# Patient Record
Sex: Male | Born: 1985 | Race: Black or African American | Hispanic: No | Marital: Single | Smoking: Never smoker
Health system: Southern US, Community
[De-identification: ages and names within clinical notes are randomized; demographics above are authoritative.]

---

## 2004-07-10 ENCOUNTER — Ambulatory Visit: Payer: Self-pay | Admitting: Pediatrics

## 2007-03-09 ENCOUNTER — Emergency Department: Payer: Self-pay | Admitting: Emergency Medicine

## 2009-07-15 ENCOUNTER — Emergency Department: Payer: Self-pay | Admitting: Emergency Medicine

## 2010-04-21 ENCOUNTER — Emergency Department: Payer: Self-pay | Admitting: Emergency Medicine

## 2010-08-04 ENCOUNTER — Emergency Department: Payer: Self-pay | Admitting: Emergency Medicine

## 2011-01-18 ENCOUNTER — Emergency Department: Payer: Self-pay | Admitting: Unknown Physician Specialty

## 2011-03-08 ENCOUNTER — Emergency Department: Payer: Self-pay | Admitting: Emergency Medicine

## 2014-01-14 ENCOUNTER — Ambulatory Visit: Payer: Self-pay | Admitting: Family Medicine

## 2014-02-08 ENCOUNTER — Ambulatory Visit: Payer: Self-pay | Admitting: Family Medicine

## 2015-09-26 IMAGING — CR CERVICAL SPINE - FLEXION AND EXTENSION VIEWS ONLY
1 series · 3 of 3 positions shown · non-contrast
Comparison: Cervical spine series of January 14, 2014

CLINICAL DATA: [age] male with down syndrome. For
clearance for sports; assess subtle and toe dense instability.

EXAM:
CERVICAL SPINE - FLEXION AND EXTENSION VIEWS ONLY

[Series 1: flexion · 0.17mm/px · 3 of 3 slices shown]
[im 1/3]
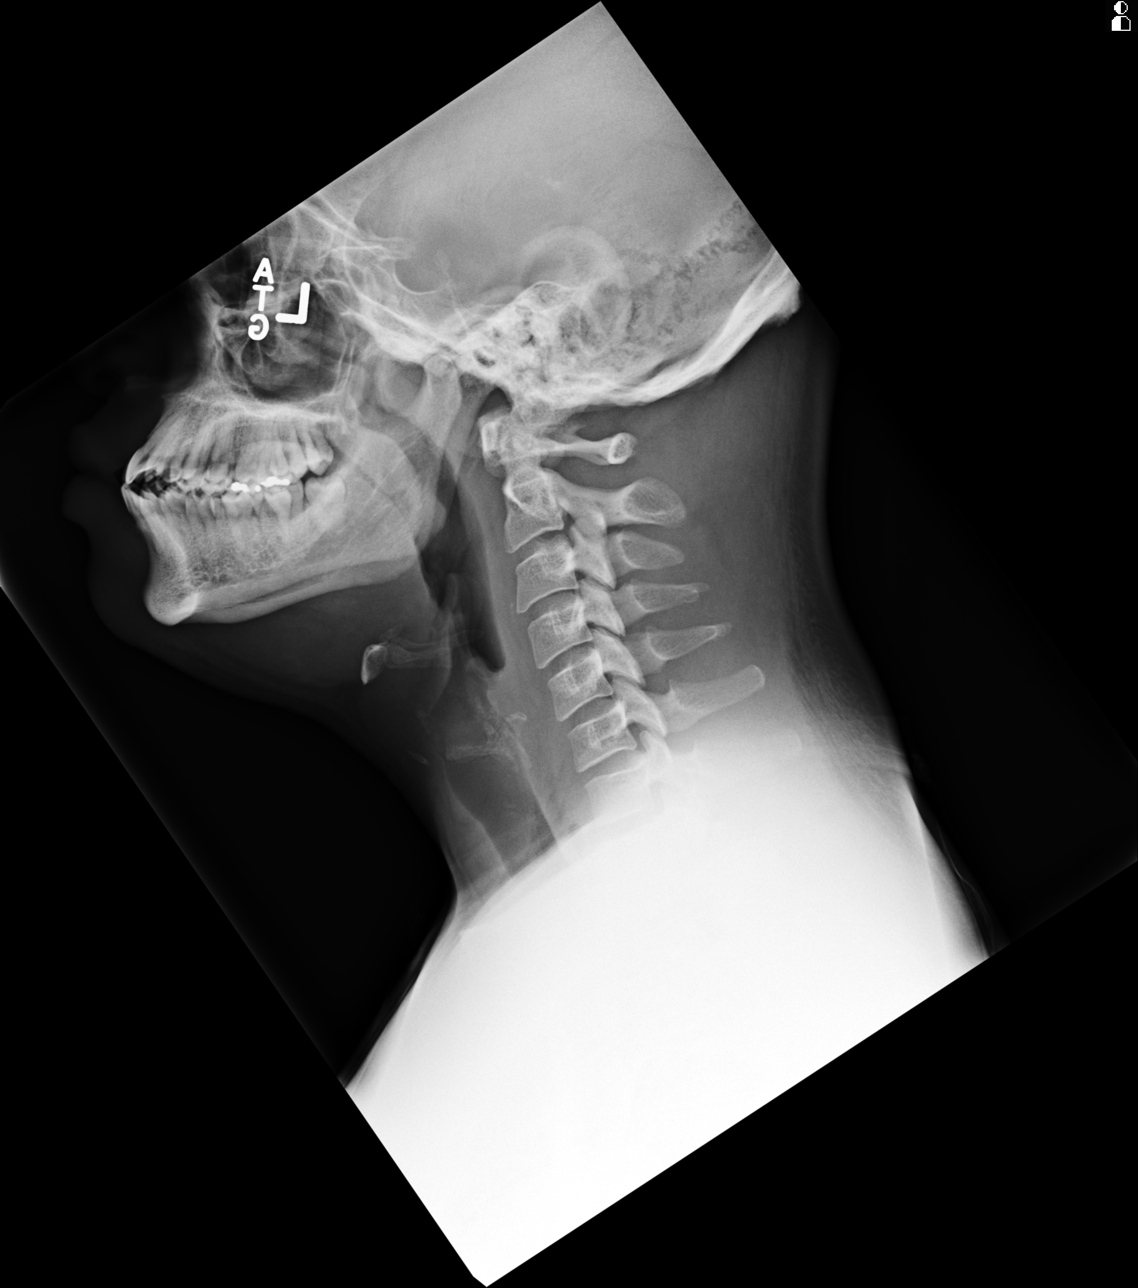
[im 2/3]
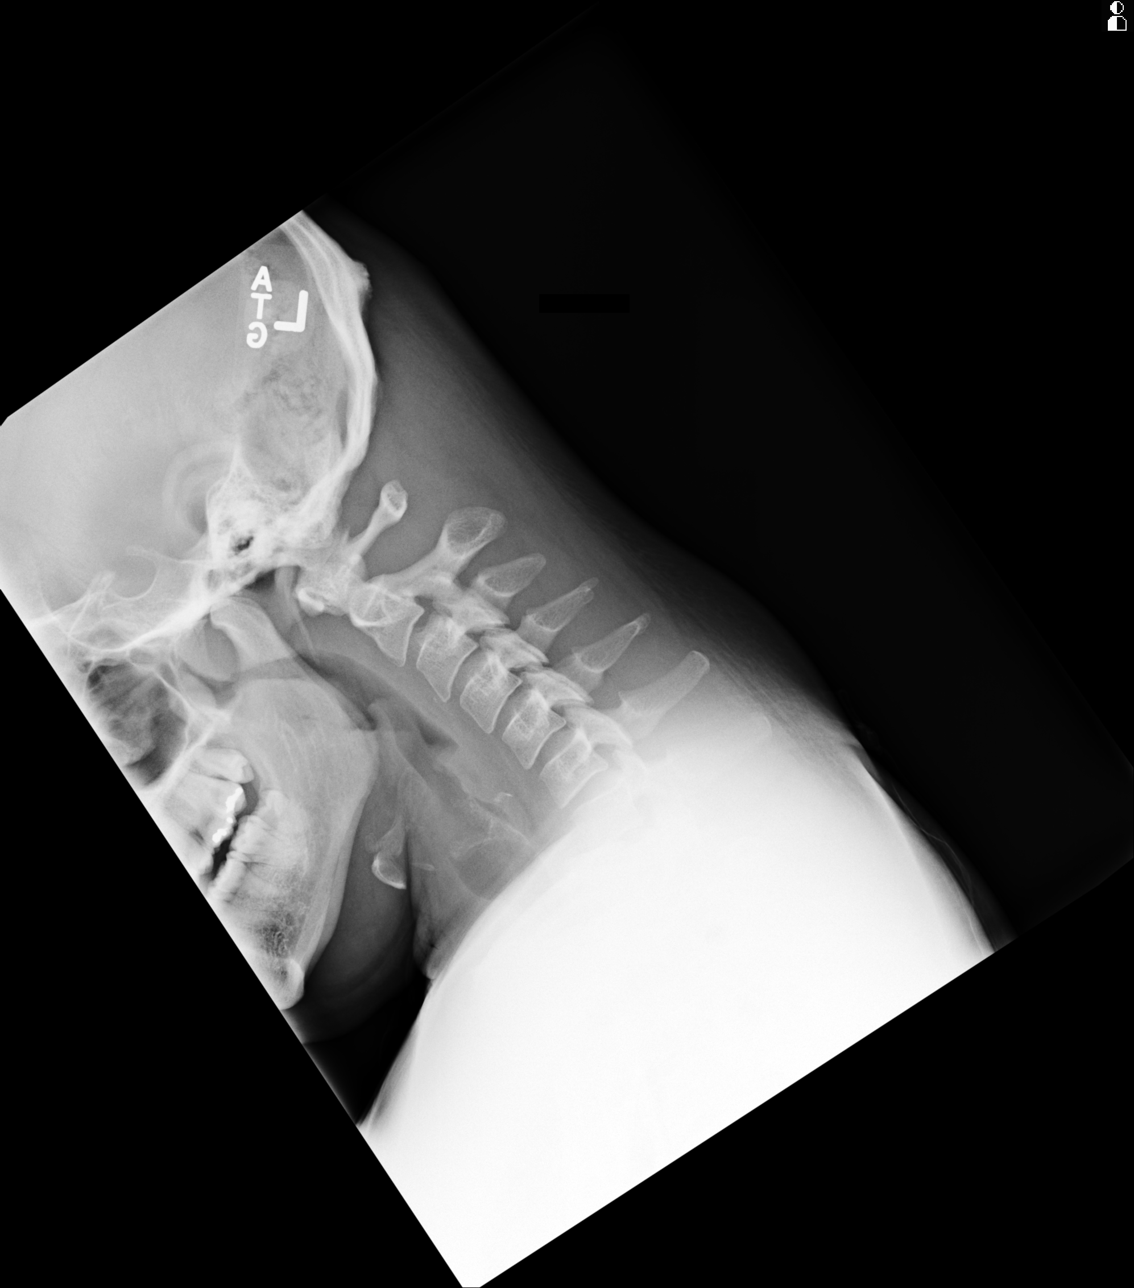
[im 3/3]
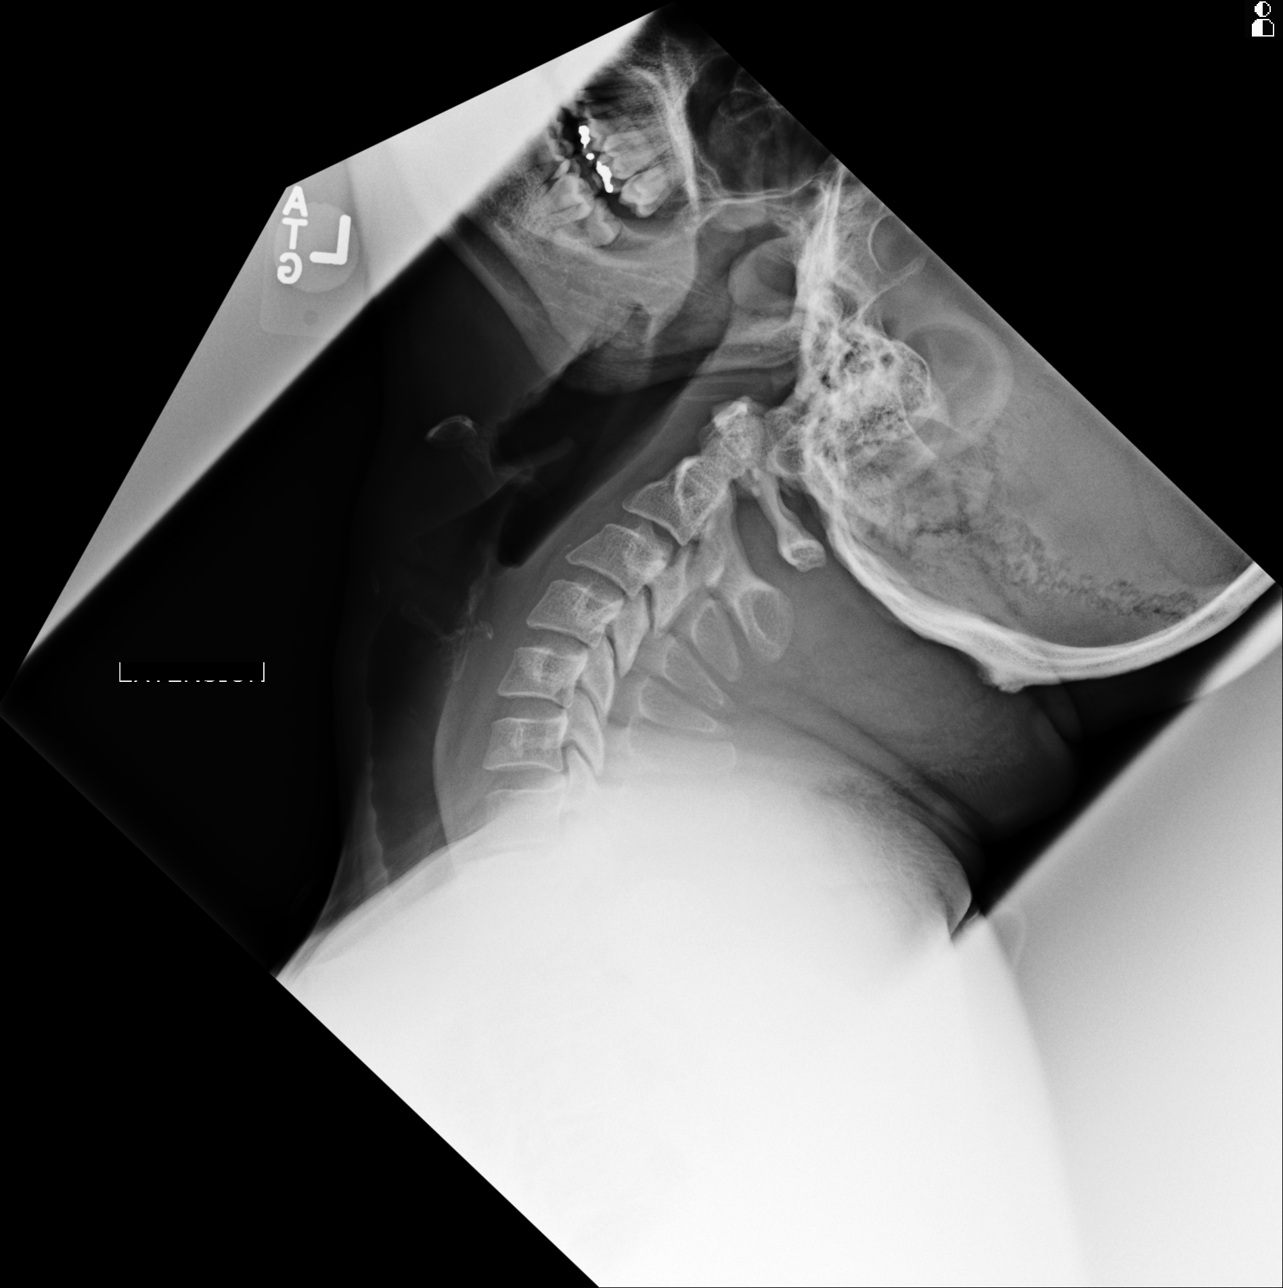

[3 of 3 positions shown; findings below may reference images not displayed]

FINDINGS: Flexion, extension, and neutral views of the cervical spine reveal
the atlanto-dens interval to be stable with changes in position. It
measures 2 mm today. The prevertebral soft tissue spaces are normal.
Elsewhere the cervical spine exhibits no acute abnormalities.
IMPRESSION: The atlanto dens interval appears normal on today's study with no
evidence of instability as the patient moves from the neutral to the
flexed and extended positions.

## 2020-04-03 ENCOUNTER — Emergency Department: Payer: Medicaid Other

## 2020-04-03 ENCOUNTER — Emergency Department
Admission: EM | Admit: 2020-04-03 | Discharge: 2020-04-03 | Disposition: A | Discharge status: Home / Self Care | Payer: Medicaid Other | Attending: Emergency Medicine | Admitting: Emergency Medicine

## 2020-04-03 ENCOUNTER — Other Ambulatory Visit: Payer: Self-pay

## 2020-04-03 DIAGNOSIS — K5901 Slow transit constipation: Secondary | ICD-10-CM | POA: Diagnosis not present

## 2020-04-03 DIAGNOSIS — R109 Unspecified abdominal pain: Secondary | ICD-10-CM | POA: Diagnosis present

## 2020-04-03 LAB — COMPREHENSIVE METABOLIC PANEL
ALT: 17 U/L (ref 0–44)
AST: 23 U/L (ref 15–41)
Albumin: 4.3 g/dL (ref 3.5–5.0)
Alkaline Phosphatase: 48 U/L (ref 38–126)
Anion gap: 11 (ref 5–15)
BUN: 13 mg/dL (ref 6–20)
CO2: 27 mmol/L (ref 22–32)
Calcium: 9.1 mg/dL (ref 8.9–10.3)
Chloride: 99 mmol/L (ref 98–111)
Creatinine, Ser: 1.14 mg/dL (ref 0.61–1.24)
GFR, Estimated: >60 mL/min (ref >60)
Glucose, Bld: 107 mg/dL — ABNORMAL HIGH (ref 70–99)
Potassium: 4.1 mmol/L (ref 3.5–5.1)
Sodium: 137 mmol/L (ref 135–145)
Total Bilirubin: 1 mg/dL (ref 0.3–1.2)
Total Protein: 8.4 g/dL — ABNORMAL HIGH (ref 6.5–8.1)

## 2020-04-03 LAB — URINALYSIS, COMPLETE (UACMP) WITH MICROSCOPIC
Bacteria, UA: NONE SEEN
Bilirubin Urine: NEGATIVE
Glucose, UA: NEGATIVE mg/dL
Ketones, ur: 20 mg/dL — AB
Leukocytes,Ua: NEGATIVE
Nitrite: NEGATIVE
Protein, ur: 30 mg/dL — AB
Specific Gravity, Urine: 1.02 (ref 1.005–1.030)
Squamous Epithelial / HPF: NONE SEEN (ref 0–5)
pH: 5 (ref 5.0–8.0)

## 2020-04-03 LAB — CBC
HCT: 44.7 % (ref 39.0–52.0)
Hemoglobin: 15.2 g/dL (ref 13.0–17.0)
MCH: 31 pg (ref 26.0–34.0)
MCHC: 34 g/dL (ref 30.0–36.0)
MCV: 91 fL (ref 80.0–100.0)
Platelets: 219 10*3/uL (ref 150–400)
RBC: 4.91 MIL/uL (ref 4.22–5.81)
RDW: 14.6 % (ref 11.5–15.5)
WBC: 10.2 10*3/uL (ref 4.0–10.5)
nRBC: 0 % (ref 0.0–0.2)

## 2020-04-03 LAB — LIPASE, BLOOD: Lipase: 21 U/L (ref 11–51)

## 2020-04-03 MED ORDER — MAGNESIUM CITRATE PO SOLN: 1.0000 | Freq: Once | ORAL | 0 refills | Status: AC

## 2020-04-03 MED ORDER — DOCUSATE SODIUM 50 MG/5ML PO LIQD
50.0000 mg | Freq: Once | ORAL | Status: AC
  Administered 2020-04-03: 50 mg
  Filled 2020-04-03: qty 10

## 2020-04-03 MED ORDER — SENNOSIDES-DOCUSATE SODIUM 8.6-50 MG PO TABS: 1.0000 | ORAL_TABLET | Freq: Every day | ORAL | 0 refills | Status: AC

## 2020-04-03 MED ORDER — MAGNESIUM CITRATE PO SOLN
1.0000 | Freq: Once | ORAL | Status: AC
  Administered 2020-04-03: 1
  Filled 2020-04-03: qty 296

## 2020-04-03 NOTE — ED Notes (Signed)
100% of enema instilled. Pt in bed holding enema at this time. Pt tolerated procedure well.

## 2020-04-03 NOTE — ED Provider Notes (Signed)
[facility] Emergency Department Provider Note  ____________________________________________  Time seen: Approximately 2:37 PM  I have reviewed the triage vital signs and the nursing notes.   HISTORY  Chief Complaint Constipation and Abdominal Pain  Patient has a legal guardian, mother is legal guardian and present at this time.  HPI Shawn Rangel is a 34 y.o. male who presents the emergency department complaining of constipation for over a week.  According to the patient, he is having some cramping in his left abdomen.  Mother reports that the patient was placed on prune juice  to alleviate some of his constipation.  Patient tells me "I cannot poop."  According to the mother the patient has had some intermittent issues with constipation in the past.  It has been several years since the last significant bout of constipation.  Until patient had prune juice, mother states that the patient had had no complaints.  There has been no fevers or chills, he upper respiratory complaints.  No urinary complaints.        History reviewed. No pertinent past medical history.  There are no problems to display for this patient.   History reviewed. No pertinent surgical history.  Prior to Admission medications   Medication Sig Start Date End Date Taking? Authorizing Provider  magnesium citrate SOLN Take 296 mLs (1 Bottle total) by mouth once for 1 dose. 04/03/20 04/03/20  Caidon Foti, Delorise Royals, PA-C  senna-docusate (SENOKOT-S) 8.6-50 MG tablet Take 1 tablet by mouth daily. 04/03/20   Theophil Thivierge, Delorise Royals, PA-C    Allergies Patient has no allergy information on record.  History reviewed. No pertinent family history.  Social History Social History   Tobacco Use  . Smoking status: Never Smoker  . Smokeless tobacco: Never Used  Substance Use Topics  . Alcohol use: Never  . Drug use: Never     Review of Systems  Constitutional: No fever/chills Eyes: No  visual changes. No discharge ENT: No upper respiratory complaints. Cardiovascular: no chest pain. Respiratory: no cough. No SOB. Gastrointestinal: Left-sided abdominal cramping/abdominal pain.  No nausea, no vomiting.  No diarrhea.  Positive constipation. Genitourinary: Negative for dysuria. No hematuria Musculoskeletal: Negative for musculoskeletal pain. Skin: Negative for rash, abrasions, lacerations, ecchymosis. Neurological: Negative for headaches, focal weakness or numbness. 10-point ROS otherwise negative.  ____________________________________________   PHYSICAL EXAM:  VITAL SIGNS: ED Triage Vitals  Enc Vitals Group     BP 04/03/20 1135 122/78     Pulse Rate 04/03/20 1135 (!) 107     Resp 04/03/20 1135 16     Temp 04/03/20 1135 98.5 F (36.9 C)     Temp Source 04/03/20 1135 Oral     SpO2 04/03/20 1135 100 %     Weight 04/03/20 1136 126 lb (57.2 kg)     Height 04/03/20 1136 4\' 10"  (1.473 m)     Head Circumference --      Peak Flow --      Pain Score --      Pain Loc --      Pain Edu? --      Excl. in GC? --      Constitutional: Alert and oriented. Well appearing and in no acute distress. Eyes: Conjunctivae are normal. PERRL. EOMI. Head: Atraumatic. ENT:      Ears:       Nose: No congestion/rhinnorhea.      Mouth/Throat: Mucous membranes are moist.  Neck: No stridor.   Cardiovascular: Normal rate, regular rhythm. Normal S1  and S2.  Good peripheral circulation. Respiratory: Normal respiratory effort without tachypnea or retractions. Lungs CTAB. Good air entry to the bases with no decreased or absent breath sounds. Gastrointestinal: Slightly decreased bowel sounds on the left side.  Bowel sounds still present in all 4 quadrants however.. Soft and nontender to palpation. No guarding or rigidity. No palpable masses. No distention. No CVA tenderness. Musculoskeletal: Full range of motion to all extremities. No gross deformities appreciated. Neurologic:  Normal speech  and language. No gross focal neurologic deficits are appreciated.  Skin:  Skin is warm, dry and intact. No rash noted. Psychiatric: Mood and affect are normal. Speech and behavior are normal. Patient exhibits appropriate insight and judgement.   ____________________________________________   LABS (all labs ordered are listed, but only abnormal results are displayed)  Labs Reviewed  COMPREHENSIVE METABOLIC PANEL - Abnormal; Notable for the following components:      Result Value   Glucose, Bld 107 (*)    Total Protein 8.4 (*)    All other components within normal limits  URINALYSIS, COMPLETE (UACMP) WITH MICROSCOPIC - Abnormal; Notable for the following components:   Color, Urine YELLOW (*)    APPearance CLEAR (*)    Hgb urine dipstick MODERATE (*)    Ketones, ur 20 (*)    Protein, ur 30 (*)    All other components within normal limits  LIPASE, BLOOD  CBC   ____________________________________________  EKG   ____________________________________________  RADIOLOGY I personally viewed and evaluated these images as part of my medical decision making, as well as reviewing the written report by the radiologist.  DG Abdomen 1 View  Result Date: 04/03/2020 CLINICAL DATA:  Abdominal and rectal pain. EXAM: ABDOMEN - 1 VIEW COMPARISON:  None. FINDINGS: Nonobstructed bowel gas pattern. Stool within the rectum. Osseous structures unremarkable. IMPRESSION: Nonobstructed bowel gas pattern. Electronically Signed   By: Annia Belt M.D.   On: 04/03/2020 15:45    ____________________________________________    PROCEDURES  Procedure(s) performed:    Procedures    Medications  docusate (COLACE) 50 MG/5ML liquid 50 mg (has no administration in time range)  magnesium citrate solution 1 Bottle (has no administration in time range)     ____________________________________________   INITIAL IMPRESSION / ASSESSMENT AND PLAN / ED COURSE  Pertinent labs & imaging results that  were available during my care of the patient were reviewed by me and considered in my medical decision making (see chart for details).  Review of the [place] CSRS was performed in accordance of the [facility] prior to dispensing any controlled drugs.           Patient's diagnosis is consistent with constipation.  Patient presented to the emergency department with significant constipation.  He was complaining of rectal pain from stool.  According to the mother, patient has bouts with constipation and has had to have enema in the past.  Imaging revealed significant stool burden.  Patient had watery leakage around the stool burden.  At this time it was felt that enema would be most efficient in helping patient with his constipation.  Patient will be prescribed mag citrate, Senokot at home in addition to his daily Metamucil..  Follow-up with primary care as needed.  Patient is given ED precautions to return to the ED for any worsening or new symptoms.     ____________________________________________  FINAL CLINICAL IMPRESSION(S) / ED DIAGNOSES  Final diagnoses:  Slow transit constipation      NEW MEDICATIONS STARTED DURING THIS VISIT:  ED Discharge Orders         Ordered    magnesium citrate SOLN   Once       Note to Pharmacy: 10 bottles   04/03/20 1714    senna-docusate (SENOKOT-S) 8.6-50 MG tablet  Daily        04/03/20 1714              This chart was dictated using voice recognition software/Dragon. Despite best efforts to proofread, errors can occur which can change the meaning. Any change was purely unintentional.    Racheal Patches, PA-C 04/03/20 1715    Sharyn Creamer, MD 04/05/20 1346

## 2020-04-03 NOTE — ED Notes (Signed)
Pt states "it worked!"

## 2020-04-03 NOTE — ED Notes (Signed)
Pt sitting on toilet

## 2020-04-03 NOTE — ED Triage Notes (Signed)
Pt ambulatory to triage with mother/guardian. Mother reports abd pain and constipation since 10/4. abd pain starting yesterday afternoon. Small liquid BM this morning after mother gave prune juice. Denies any n/v. NAD noted at this time.

## 2023-03-17 ENCOUNTER — Emergency Department
Admission: EM | Admit: 2023-03-17 | Discharge: 2023-03-17 | Disposition: A | Discharge status: Home / Self Care | Payer: MEDICAID | Attending: Emergency Medicine | Admitting: Emergency Medicine

## 2023-03-17 ENCOUNTER — Emergency Department: Payer: MEDICAID

## 2023-03-17 DIAGNOSIS — R197 Diarrhea, unspecified: Secondary | ICD-10-CM | POA: Insufficient documentation

## 2023-03-17 DIAGNOSIS — R1013 Epigastric pain: Secondary | ICD-10-CM | POA: Insufficient documentation

## 2023-03-17 LAB — URINALYSIS, ROUTINE W REFLEX MICROSCOPIC
Bilirubin Urine: NEGATIVE
Glucose, UA: NEGATIVE mg/dL
Hgb urine dipstick: NEGATIVE
Ketones, ur: NEGATIVE mg/dL
Leukocytes,Ua: NEGATIVE
Nitrite: NEGATIVE
Protein, ur: NEGATIVE mg/dL
Specific Gravity, Urine: 1.019 (ref 1.005–1.030)
pH: 7 (ref 5.0–8.0)

## 2023-03-17 LAB — COMPREHENSIVE METABOLIC PANEL
ALT: 16 U/L (ref 0–44)
AST: 22 U/L (ref 15–41)
Albumin: 3.8 g/dL (ref 3.5–5.0)
Alkaline Phosphatase: 54 U/L (ref 38–126)
Anion gap: 6 (ref 5–15)
BUN: 20 mg/dL (ref 6–20)
CO2: 25 mmol/L (ref 22–32)
Calcium: 8.7 mg/dL — ABNORMAL LOW (ref 8.9–10.3)
Chloride: 107 mmol/L (ref 98–111)
Creatinine, Ser: 1.29 mg/dL — ABNORMAL HIGH (ref 0.61–1.24)
GFR, Estimated: >60 mL/min (ref >60)
Glucose, Bld: 107 mg/dL — ABNORMAL HIGH (ref 70–99)
Potassium: 3.7 mmol/L (ref 3.5–5.1)
Sodium: 138 mmol/L (ref 135–145)
Total Bilirubin: 0.4 mg/dL (ref 0.3–1.2)
Total Protein: 7.5 g/dL (ref 6.5–8.1)

## 2023-03-17 LAB — CBC
HCT: 45.7 % (ref 39.0–52.0)
Hemoglobin: 15 g/dL (ref 13.0–17.0)
MCH: 30.5 pg (ref 26.0–34.0)
MCHC: 32.8 g/dL (ref 30.0–36.0)
MCV: 92.9 fL (ref 80.0–100.0)
Platelets: 192 10*3/uL (ref 150–400)
RBC: 4.92 MIL/uL (ref 4.22–5.81)
RDW: 14.7 % (ref 11.5–15.5)
WBC: 6 10*3/uL (ref 4.0–10.5)
nRBC: 0 % (ref 0.0–0.2)

## 2023-03-17 LAB — LIPASE, BLOOD: Lipase: 24 U/L (ref 11–51)

## 2023-03-17 MED ORDER — IOHEXOL 300 MG/ML  SOLN
100.0000 mL | Freq: Once | INTRAMUSCULAR | Status: AC | PRN
  Administered 2023-03-17: 75 mL via INTRAVENOUS

## 2023-03-17 MED ORDER — OMEPRAZOLE 20 MG PO CPDR: 20.0000 mg | DELAYED_RELEASE_CAPSULE | Freq: Every day | ORAL | 0 refills | Status: AC

## 2023-03-17 NOTE — ED Provider Notes (Signed)
[facility] Provider Note    Event Date/Time   First MD Initiated Contact with Patient 03/17/23 0703     (approximate)   History   Abdominal Pain   HPI  Shawn Rangel is a 37 year old male presenting to the emergency department for evaluation of abdominal pain.  For the last 2 days, patient has had a few episodes of nonbloody diarrhea.  Has a history of constipation, but has been on Metamucil and been having regular bowel movements more recently.  No fevers or chills.  No nausea or vomiting.  Tolerating normal oral intake.  This morning around 3 AM patient reported he had worsening pain in his upper abdomen area leading to ER presentation.  No history of similar.  Does report a history of acid reflux symptoms for which she is on Mylanta, not on an H2 blocker or PPI.  Unsure if his pain is worse after eating but does report occasional burning sensation.     Physical Exam   Triage Vital Signs: ED Triage Vitals  Encounter Vitals Group     BP 03/17/23 0420 124/83     Systolic BP Percentile --      Diastolic BP Percentile --      Pulse Rate 03/17/23 0420 76     Resp 03/17/23 0420 16     Temp 03/17/23 0420 98.7 F (37.1 C)     Temp src --      SpO2 03/17/23 0420 100 %     Weight 03/17/23 0419 134 lb (60.8 kg)     Height 03/17/23 0419 4\' 10"  (1.473 m)     Head Circumference --      Peak Flow --      Pain Score 03/17/23 0419 5     Pain Loc --      Pain Education --      Exclude from Growth Chart --     Most recent vital signs: Vitals:   03/17/23 0600 03/17/23 0630  BP: 126/82 121/87  Pulse: 67 94  Resp:  16  Temp:    SpO2: 100% 100%     General: Awake, interactive  CV:  Regular rate, good peripheral perfusion.  Resp:  Lungs clear, unlabored respirations.  Abd:  Soft, nondistended, nontender to palpation diffusely without rebound or guarding Neuro:  Symmetric facial movement, fluid speech   ED Results / Procedures / Treatments    Labs (all labs ordered are listed, but only abnormal results are displayed) Labs Reviewed  COMPREHENSIVE METABOLIC PANEL - Abnormal; Notable for the following components:      Result Value   Glucose, Bld 107 (*)    Creatinine, Ser 1.29 (*)    Calcium 8.7 (*)    All other components within normal limits  URINALYSIS, ROUTINE W REFLEX MICROSCOPIC - Abnormal; Notable for the following components:   Color, Urine YELLOW (*)    APPearance CLEAR (*)    All other components within normal limits  LIPASE, BLOOD  CBC     EKG EKG independently reviewed interpreted by myself (ER attending) demonstrates:    RADIOLOGY Imaging independently reviewed and interpreted by myself demonstrates:  CT abdomen pelvis without evidence of bowel obstruction or other acute findings  PROCEDURES:  Critical Care performed: No  Procedures   MEDICATIONS ORDERED IN ED: Medications  iohexol (OMNIPAQUE) 300 MG/ML solution 100 mL (75 mLs Intravenous Contrast Given 03/17/23 0712)     IMPRESSION / MDM / ASSESSMENT AND PLAN / ED COURSE  I reviewed the triage vital signs and the nursing notes.  Differential diagnosis includes, but is not limited to, pancreatitis, diverticulitis, viral GI illness, gastritis, peptic ulcer disease  Patient's presentation is most consistent with acute presentation with potential threat to life or bodily function.  37 year old male presenting to the emergency department for evaluation of abdominal pain.  Vital signs stable on presentation.  Labs sent from triage without significant derangement.  No evidence of UTI.  CT abdomen pelvis without acute findings.  Patient and mother updated on results of workup.  Clinical history suggestive of possible gastritis/peptic ulcer disease without complication.  Discussed trial of PPI to see if this improves symptoms and outpatient follow-up.  Patient and family comfortable with this plan.  Strict return precautions provided.  Patient discharged  stable condition.  FINAL CLINICAL IMPRESSION(S) / ED DIAGNOSES   Final diagnoses:  Epigastric pain  Diarrhea, unspecified type     Rx / DC Orders   ED Discharge Orders          Ordered    omeprazole (PRILOSEC) 20 MG capsule  Daily        03/17/23 0742             Note:  This document was prepared using Dragon voice recognition software and may include unintentional dictation errors.   Trinna Post, MD 03/17/23 [phone]

## 2023-03-17 NOTE — Discharge Instructions (Addendum)
You were seen in the emergency department today for evaluation of your abdominal pain and diarrhea.  Your testing was overall reassuring.  I recommend starting a PPI medicine for the next 2 weeks to see if this helps with your symptoms.  Follow-up with your primary care doctor for further evaluation.  If you continue to have diarrhea, you can try over-the-counter medicines such as Immodium to see if this helps.  Return to the ER for new or worsening symptoms.

## 2023-03-17 NOTE — ED Triage Notes (Signed)
Pt reports upper abdominal pain that began tonight that woke him up. Mom reports pt has had issues with constipation in the past but has had diarrhea the last couple days.

## 2023-04-26 ENCOUNTER — Other Ambulatory Visit: Payer: Self-pay | Admitting: Family Medicine

## 2023-04-26 DIAGNOSIS — R1032 Left lower quadrant pain: Secondary | ICD-10-CM

## 2023-05-09 ENCOUNTER — Ambulatory Visit
Admission: RE | Admit: 2023-05-09 | Discharge: 2023-05-09 | Disposition: A | Discharge status: Home / Self Care | Payer: MEDICAID | Source: Ambulatory Visit | Attending: Family Medicine | Admitting: Family Medicine

## 2023-05-09 DIAGNOSIS — R1032 Left lower quadrant pain: Secondary | ICD-10-CM | POA: Insufficient documentation

## 2023-05-09 MED ORDER — IOHEXOL 300 MG/ML  SOLN
80.0000 mL | Freq: Once | INTRAMUSCULAR | Status: AC | PRN
  Administered 2023-05-09: 80 mL via INTRAVENOUS
